# Patient Record
Sex: Female | Born: 1940 | Race: White | Hispanic: No | Marital: Married | Smoking: Never smoker
Health system: Southern US, Community
[De-identification: ages and names within clinical notes are randomized; demographics above are authoritative.]

## PROBLEM LIST (undated history)

## (undated) DIAGNOSIS — C801 Malignant (primary) neoplasm, unspecified: Secondary | ICD-10-CM

## (undated) DIAGNOSIS — E119 Type 2 diabetes mellitus without complications: Secondary | ICD-10-CM

## (undated) DIAGNOSIS — E785 Hyperlipidemia, unspecified: Secondary | ICD-10-CM

## (undated) HISTORY — PX: OTHER SURGICAL HISTORY: SHX169

---

## 2004-08-27 ENCOUNTER — Ambulatory Visit: Payer: Self-pay | Admitting: Internal Medicine

## 2004-08-28 ENCOUNTER — Ambulatory Visit (HOSPITAL_COMMUNITY): Admission: EM | Admit: 2004-08-28 | Discharge: 2004-08-28 | Payer: Self-pay | Admitting: Emergency Medicine

## 2004-11-30 ENCOUNTER — Emergency Department: Payer: Self-pay | Admitting: Emergency Medicine

## 2004-12-04 ENCOUNTER — Ambulatory Visit: Payer: Self-pay | Admitting: Internal Medicine

## 2004-12-23 ENCOUNTER — Ambulatory Visit: Payer: Self-pay | Admitting: Internal Medicine

## 2005-02-04 ENCOUNTER — Emergency Department: Payer: Self-pay | Admitting: Emergency Medicine

## 2005-04-21 ENCOUNTER — Ambulatory Visit: Payer: Self-pay | Admitting: Internal Medicine

## 2006-05-07 ENCOUNTER — Ambulatory Visit: Payer: Self-pay | Admitting: Internal Medicine

## 2007-05-11 ENCOUNTER — Ambulatory Visit: Payer: Self-pay | Admitting: Internal Medicine

## 2008-01-03 ENCOUNTER — Ambulatory Visit: Payer: Self-pay | Admitting: Gastroenterology

## 2009-02-27 ENCOUNTER — Ambulatory Visit: Payer: Self-pay | Admitting: Internal Medicine

## 2010-07-31 ENCOUNTER — Ambulatory Visit: Payer: Self-pay | Admitting: Internal Medicine

## 2011-08-27 ENCOUNTER — Ambulatory Visit: Payer: Self-pay | Admitting: Ophthalmology

## 2011-10-07 ENCOUNTER — Ambulatory Visit: Payer: Self-pay | Admitting: Ophthalmology

## 2012-03-18 ENCOUNTER — Ambulatory Visit: Payer: Self-pay | Admitting: Family Medicine

## 2013-06-02 ENCOUNTER — Ambulatory Visit: Payer: Self-pay | Admitting: Family Medicine

## 2013-08-03 ENCOUNTER — Ambulatory Visit: Payer: Self-pay | Admitting: Gastroenterology

## 2013-08-04 LAB — PATHOLOGY REPORT

## 2014-06-19 ENCOUNTER — Ambulatory Visit: Payer: Self-pay | Admitting: Family Medicine

## 2014-11-07 ENCOUNTER — Emergency Department: Payer: Self-pay | Admitting: Emergency Medicine

## 2014-11-13 ENCOUNTER — Emergency Department: Payer: Self-pay | Admitting: Emergency Medicine

## 2014-11-18 ENCOUNTER — Emergency Department: Payer: Self-pay | Admitting: Emergency Medicine

## 2015-06-01 ENCOUNTER — Other Ambulatory Visit: Payer: Self-pay | Admitting: Family Medicine

## 2015-06-01 DIAGNOSIS — Z1231 Encounter for screening mammogram for malignant neoplasm of breast: Secondary | ICD-10-CM

## 2015-06-21 ENCOUNTER — Other Ambulatory Visit: Payer: Self-pay | Admitting: Family Medicine

## 2015-06-21 ENCOUNTER — Ambulatory Visit
Admission: RE | Admit: 2015-06-21 | Discharge: 2015-06-21 | Disposition: A | Discharge status: Home / Self Care | Payer: Medicare PPO | Source: Ambulatory Visit | Attending: Family Medicine | Admitting: Family Medicine

## 2015-06-21 DIAGNOSIS — Z1231 Encounter for screening mammogram for malignant neoplasm of breast: Secondary | ICD-10-CM

## 2016-05-06 ENCOUNTER — Emergency Department: Payer: Medicare Other

## 2016-05-06 ENCOUNTER — Emergency Department
Admission: EM | Admit: 2016-05-06 | Discharge: 2016-05-07 | Disposition: A | Discharge status: Home / Self Care | Payer: Medicare Other | Attending: Emergency Medicine | Admitting: Emergency Medicine

## 2016-05-06 DIAGNOSIS — R51 Headache: Secondary | ICD-10-CM | POA: Diagnosis present

## 2016-05-06 DIAGNOSIS — E785 Hyperlipidemia, unspecified: Secondary | ICD-10-CM | POA: Insufficient documentation

## 2016-05-06 DIAGNOSIS — E119 Type 2 diabetes mellitus without complications: Secondary | ICD-10-CM | POA: Diagnosis not present

## 2016-05-06 DIAGNOSIS — G4485 Primary stabbing headache: Secondary | ICD-10-CM | POA: Insufficient documentation

## 2016-05-06 DIAGNOSIS — Z86718 Personal history of other venous thrombosis and embolism: Secondary | ICD-10-CM | POA: Diagnosis not present

## 2016-05-06 DIAGNOSIS — E039 Hypothyroidism, unspecified: Secondary | ICD-10-CM | POA: Diagnosis not present

## 2016-05-06 LAB — CBC WITH DIFFERENTIAL/PLATELET
Basophils Absolute: 0.1 10*3/uL (ref 0–0.1)
Basophils Relative: 1 %
Eosinophils Absolute: 0.3 10*3/uL (ref 0–0.7)
Eosinophils Relative: 4 %
HCT: 39.7 % (ref 35.0–47.0)
Hemoglobin: 13.2 g/dL (ref 12.0–16.0)
Lymphocytes Relative: 36 %
Lymphs Abs: 2.9 10*3/uL (ref 1.0–3.6)
MCH: 28.5 pg (ref 26.0–34.0)
MCHC: 33.3 g/dL (ref 32.0–36.0)
MCV: 85.7 fL (ref 80.0–100.0)
Monocytes Absolute: 0.5 10*3/uL (ref 0.2–0.9)
Monocytes Relative: 7 %
Neutro Abs: 4.3 10*3/uL (ref 1.4–6.5)
Neutrophils Relative %: 52 %
Platelets: 209 10*3/uL (ref 150–440)
RBC: 4.62 MIL/uL (ref 3.80–5.20)
RDW: 13.7 % (ref 11.5–14.5)
WBC: 8.1 10*3/uL (ref 3.6–11.0)

## 2016-05-06 LAB — SEDIMENTATION RATE: Sed Rate: 17 mm/hr (ref 0–30)

## 2016-05-06 LAB — BASIC METABOLIC PANEL
Anion gap: 7 (ref 5–15)
BUN: 16 mg/dL (ref 6–20)
CO2: 26 mmol/L (ref 22–32)
Calcium: 9.1 mg/dL (ref 8.9–10.3)
Chloride: 108 mmol/L (ref 101–111)
Creatinine, Ser: 0.75 mg/dL (ref 0.44–1.00)
GFR calc Af Amer: >60 mL/min (ref >60)
GFR calc non Af Amer: >60 mL/min (ref >60)
Glucose, Bld: 150 mg/dL — ABNORMAL HIGH (ref 65–99)
Potassium: 4.2 mmol/L (ref 3.5–5.1)
Sodium: 141 mmol/L (ref 135–145)

## 2016-05-06 MED ORDER — KETOROLAC TROMETHAMINE 30 MG/ML IJ SOLN
10.0000 mg | Freq: Once | INTRAMUSCULAR | Status: AC
Start: 2016-05-07 — End: 2016-05-06
  Administered 2016-05-06: 9.9 mg via INTRAVENOUS
  Filled 2016-05-06: qty 1

## 2016-05-06 NOTE — ED Notes (Signed)
Pt in with co severe headache since 1500 today no hx of headache states worst ever.

## 2016-05-06 NOTE — ED Notes (Signed)
Pt reports she has a sharp stabbing pain in her head - She reports that the pain comes and goes - This issue has been present since 3pm today - Pt denies nausea and vomiting - Pt denies dizziness - Pt reports vision is normal

## 2016-05-06 NOTE — ED Provider Notes (Signed)
[facility] Emergency Department Provider Note   ____________________________________________  Time seen: Approximately 11:17 PM  I have reviewed the triage vital signs and the nursing notes.   HISTORY  Chief Complaint Headache    HPI Kathryn Webster is a 75 y.o. female who presents to the ED from home with a chief complaint of headache. Patient describes onset of sharp stabbing pains to her left parietal head which awoke her approximately 3 PM from a nap. Pains last a few seconds then goes away. Symptoms are not associated with photophobia, dizziness, lightheadedness, nausea, vomiting or neck pain. Patient presents this evening secondary to increased frequency of sharp stabbing pains to her left head. Denies recent fever, chills, chest pain, shortness of breath, abdominal pain. Denies recent travel or trauma. Nothing makes her symptoms better or worse.Denies recent tick bite. States she had migraines many years ago but none since.   Past medical history Hyperlipidemia, unspecified    Hypothyroidism, unspecified    Stress incontinence    Osteopenia  DEXA 07/31/2011  Diabetes mellitus type 2, uncomplicated (CMS-HCC)    DVT (deep venous thrombosis) (CMS-HCC)      There are no active problems to display for this patient.   Past surgical history CHOLECYSTECTOMY 1992    TONSILLECTOMY     COLONOSCOPY 08/03/2013  one small polyp in ascending colon - Dr. Verdie Shire; repeat in 5 years  UPPER ENDOSCOPY 08/15/1982, 01/03/2008  Dr. Sonny Masters  Right leg laser surgery       No current outpatient prescriptions on file.  Allergies Botox  Family history None for migraines or cerebral aneurysm  Social History Social History  Substance Use Topics  . Smoking status: Not on file  . Smokeless tobacco: Not on file  . Alcohol Use: Not on file  Nonsmoker  Review of Systems  Constitutional: No fever/chills. Eyes: No visual changes. ENT: No sore  throat. Cardiovascular: Denies chest pain. Respiratory: Denies shortness of breath. Gastrointestinal: No abdominal pain.  No nausea, no vomiting.  No diarrhea.  No constipation. Genitourinary: Negative for dysuria. Musculoskeletal: Negative for back pain. Skin: Negative for rash. Neurological: Positive for headache. Negative for focal weakness or numbness.  10-point ROS otherwise negative.  ____________________________________________   PHYSICAL EXAM:  VITAL SIGNS: ED Triage Vitals  Enc Vitals Group     BP 05/06/16 2146 142/62 mmHg     Pulse Rate 05/06/16 2146 88     Resp 05/06/16 2146 18     Temp 05/06/16 2146 97.8 F (36.6 C)     Temp Source 05/06/16 2146 Oral     SpO2 05/06/16 2146 100 %     Weight 05/06/16 2146 120 lb (54.432 kg)     Height 05/06/16 2146 5\' 2"  (1.575 m)     Head Cir --      Peak Flow --      Pain Score 05/06/16 2147 8     Pain Loc --      Pain Edu? --      Excl. in [place]? --     Constitutional: Alert and oriented. Well appearing and in no acute distress. Eyes: Conjunctivae are normal. PERRL. EOMI. Fundoscopy WNL. Head: Atraumatic. Temples are nontender. Ears: Slight fluid behind bilateral TMs, otherwise WNL. Nose: No congestion/rhinnorhea. Mouth/Throat: Mucous membranes are moist.  Oropharynx non-erythematous. Neck: No stridor.  No carotid bruits.  No neck without meningismus. Cardiovascular: Normal rate, regular rhythm. Grossly normal heart sounds.  Good peripheral circulation. Respiratory: Normal respiratory effort.  No  retractions. Lungs CTAB. Gastrointestinal: Soft and nontender. No distention. No abdominal bruits. No CVA tenderness. Musculoskeletal: No lower extremity tenderness nor edema.  No joint effusions. Neurologic:  Normal speech and language. No gross focal neurologic deficits are appreciated. No gait instability. Skin:  Skin is warm, dry and intact. No rash noted. Psychiatric: Mood and affect are normal. Speech and behavior are  normal.  ____________________________________________   LABS (all labs ordered are listed, but only abnormal results are displayed)  Labs Reviewed  BASIC METABOLIC PANEL - Abnormal; Notable for the following:    Glucose, Bld 150 (*)    All other components within normal limits  CBC WITH DIFFERENTIAL/PLATELET  SEDIMENTATION RATE   ____________________________________________  EKG  None ____________________________________________  RADIOLOGY  CT head without contrast interpreted per Dr. Radene Knee: 1. No acute intracranial pathology seen on CT. 2. Mild small vessel ischemic microangiopathy. ____________________________________________   PROCEDURES  Procedure(s) performed: None  Critical Care performed: No  ____________________________________________   INITIAL IMPRESSION / ASSESSMENT AND PLAN / ED COURSE  Pertinent labs & imaging results that were available during my care of the patient were reviewed by me and considered in my medical decision making (see chart for details).  75 year old female who presents with sharp stabbing pains to her left parietal head lasting seconds. Neck is supple without focal neurological deficits on exam. During my interview she visibly winces when the headaches hit. She denies pain in her face. We discussed whether she may be starting to have symptoms of trigeminal neuralgia. I entertained the thought of starting her on carbamazepine. I was going to discuss this with the on-call neurologist; however, there is no neurologist available to me at this time for discussion. I did offer patient a [facility] neurology consultation but it turns out that her spouse works at [employer] [facility] and will be able to get her in to see neurology there. If not, he use to work with [facility] neurology group and is confident he will be able to secure her an appointment shortly. For the time being, patient was given low-dose IV Toradol in an attempt to dampen future pain. She has  Vicodin at home which she will take in addition to ibuprofen as needed. Strict return precautions given. Both verbalize understanding and agree with plan of care. ____________________________________________   FINAL CLINICAL IMPRESSION(S) / ED DIAGNOSES  Final diagnoses:  Primary stabbing headache      NEW MEDICATIONS STARTED DURING THIS VISIT:  New Prescriptions   No medications on file     Note:  This document was prepared using Dragon voice recognition software and may include unintentional dictation errors.    Paulette Blanch, MD 05/07/16 [phone]

## 2016-05-07 NOTE — Discharge Instructions (Signed)
1. You may take ibuprofen and/or vicodin as needed for headache. 2. Return to the ER for worsening symptoms, persistent vomiting, lethargy or other concerns.  General Headache Without Cause A headache is pain or discomfort felt around the head or neck area. The specific cause of a headache may not be found. There are many causes and types of headaches. A few common ones are:  Tension headaches.  Migraine headaches.  Cluster headaches.  Chronic daily headaches. HOME CARE INSTRUCTIONS  Watch your condition for any changes. Take these steps to help with your condition: Managing Pain  Take over-the-counter and prescription medicines only as told by your health care provider.  Lie down in a dark, quiet room when you have a headache.  If directed, apply ice to the head and neck area:  Put ice in a plastic bag.  Place a towel between your skin and the bag.  Leave the ice on for 20 minutes, 2-3 times per day.  Use a heating pad or hot shower to apply heat to the head and neck area as told by your health care provider.  Keep lights dim if bright lights bother you or make your headaches worse. Eating and Drinking  Eat meals on a regular schedule.  Limit alcohol use.  Decrease the amount of caffeine you drink, or stop drinking caffeine. General Instructions  Keep all follow-up visits as told by your health care provider. This is important.  Keep a headache journal to help find out what may trigger your headaches. For example, write down:  What you eat and drink.  How much sleep you get.  Any change to your diet or medicines.  Try massage or other relaxation techniques.  Limit stress.  Sit up straight, and do not tense your muscles.  Do not use tobacco products, including cigarettes, chewing tobacco, or e-cigarettes. If you need help quitting, ask your health care provider.  Exercise regularly as told by your health care provider.  Sleep on a regular schedule. Get 7-9  hours of sleep, or the amount recommended by your health care provider. SEEK MEDICAL CARE IF:   Your symptoms are not helped by medicine.  You have a headache that is different from the usual headache.  You have nausea or you vomit.  You have a fever. SEEK IMMEDIATE MEDICAL CARE IF:   Your headache becomes severe.  You have repeated vomiting.  You have a stiff neck.  You have a loss of vision.  You have problems with speech.  You have pain in the eye or ear.  You have muscular weakness or loss of muscle control.  You lose your balance or have trouble walking.  You feel faint or pass out.  You have confusion.   This information is not intended to replace advice given to you by your health care provider. Make sure you discuss any questions you have with your health care provider.   Document Released: 11/10/2005 Document Revised: 08/01/2015 Document Reviewed: 03/05/2015 Elsevier Interactive Patient Education [employer].

## 2016-11-06 ENCOUNTER — Emergency Department
Admission: EM | Admit: 2016-11-06 | Discharge: 2016-11-06 | Disposition: A | Discharge status: Home / Self Care | Payer: Medicare Other | Attending: Emergency Medicine | Admitting: Emergency Medicine

## 2016-11-06 ENCOUNTER — Emergency Department: Payer: Medicare Other

## 2016-11-06 DIAGNOSIS — R1013 Epigastric pain: Secondary | ICD-10-CM | POA: Diagnosis present

## 2016-11-06 DIAGNOSIS — E119 Type 2 diabetes mellitus without complications: Secondary | ICD-10-CM | POA: Diagnosis not present

## 2016-11-06 DIAGNOSIS — Z7984 Long term (current) use of oral hypoglycemic drugs: Secondary | ICD-10-CM | POA: Diagnosis not present

## 2016-11-06 DIAGNOSIS — R109 Unspecified abdominal pain: Secondary | ICD-10-CM

## 2016-11-06 HISTORY — DX: Type 2 diabetes mellitus without complications: E11.9

## 2016-11-06 HISTORY — DX: Hyperlipidemia, unspecified: E78.5

## 2016-11-06 HISTORY — DX: Malignant (primary) neoplasm, unspecified: C80.1

## 2016-11-06 LAB — URINALYSIS, COMPLETE (UACMP) WITH MICROSCOPIC
Bacteria, UA: NONE SEEN
Bilirubin Urine: NEGATIVE
Glucose, UA: 50 mg/dL — AB
Hgb urine dipstick: NEGATIVE
Ketones, ur: 20 mg/dL — AB
Nitrite: NEGATIVE
Protein, ur: NEGATIVE mg/dL
RBC / HPF: NONE SEEN RBC/hpf (ref 0–5)
Specific Gravity, Urine: 1.017 (ref 1.005–1.030)
pH: 7 (ref 5.0–8.0)

## 2016-11-06 LAB — COMPREHENSIVE METABOLIC PANEL
ALT: 13 U/L — ABNORMAL LOW (ref 14–54)
AST: 25 U/L (ref 15–41)
Albumin: 3.8 g/dL (ref 3.5–5.0)
Alkaline Phosphatase: 47 U/L (ref 38–126)
Anion gap: 7 (ref 5–15)
BUN: 11 mg/dL (ref 6–20)
CO2: 26 mmol/L (ref 22–32)
Calcium: 9.1 mg/dL (ref 8.9–10.3)
Chloride: 108 mmol/L (ref 101–111)
Creatinine, Ser: 0.69 mg/dL (ref 0.44–1.00)
GFR calc Af Amer: >60 mL/min (ref >60)
GFR calc non Af Amer: >60 mL/min (ref >60)
Glucose, Bld: 134 mg/dL — ABNORMAL HIGH (ref 65–99)
Potassium: 3.7 mmol/L (ref 3.5–5.1)
Sodium: 141 mmol/L (ref 135–145)
Total Bilirubin: 0.8 mg/dL (ref 0.3–1.2)
Total Protein: 7 g/dL (ref 6.5–8.1)

## 2016-11-06 LAB — CBC
HCT: 40.8 % (ref 35.0–47.0)
Hemoglobin: 13.8 g/dL (ref 12.0–16.0)
MCH: 29 pg (ref 26.0–34.0)
MCHC: 33.9 g/dL (ref 32.0–36.0)
MCV: 85.5 fL (ref 80.0–100.0)
Platelets: 210 10*3/uL (ref 150–440)
RBC: 4.77 MIL/uL (ref 3.80–5.20)
RDW: 13.6 % (ref 11.5–14.5)
WBC: 9.3 10*3/uL (ref 3.6–11.0)

## 2016-11-06 LAB — TROPONIN I

## 2016-11-06 LAB — LIPASE, BLOOD: Lipase: 32 U/L (ref 11–51)

## 2016-11-06 MED ORDER — FAMOTIDINE IN NACL 20-0.9 MG/50ML-% IV SOLN
20.0000 mg | Freq: Once | INTRAVENOUS | Status: AC
  Administered 2016-11-06: 20 mg via INTRAVENOUS
  Filled 2016-11-06: qty 50

## 2016-11-06 MED ORDER — IOPAMIDOL (ISOVUE-300) INJECTION 61%
30.0000 mL | Freq: Once | INTRAVENOUS | Status: AC
  Administered 2016-11-06: 30 mL via ORAL

## 2016-11-06 MED ORDER — FAMOTIDINE 20 MG PO TABS: 20.0000 mg | ORAL_TABLET | Freq: Two times a day (BID) | ORAL | 1 refills | Status: AC

## 2016-11-06 MED ORDER — IOPAMIDOL (ISOVUE-300) INJECTION 61%
75.0000 mL | Freq: Once | INTRAVENOUS | Status: AC | PRN
  Administered 2016-11-06: 100 mL via INTRAVENOUS

## 2016-11-06 MED ORDER — GI COCKTAIL ~~LOC~~
30.0000 mL | Freq: Once | ORAL | Status: AC
  Administered 2016-11-06: 30 mL via ORAL
  Filled 2016-11-06: qty 30

## 2016-11-06 MED ORDER — SODIUM CHLORIDE 0.9 % IV BOLUS (SEPSIS)
500.0000 mL | Freq: Once | INTRAVENOUS | Status: AC
  Administered 2016-11-06: 500 mL via INTRAVENOUS

## 2016-11-06 MED ORDER — FENTANYL CITRATE (PF) 100 MCG/2ML IJ SOLN
50.0000 ug | Freq: Once | INTRAMUSCULAR | Status: AC
  Administered 2016-11-06: 50 ug via INTRAVENOUS
  Filled 2016-11-06: qty 2

## 2016-11-06 NOTE — ED Notes (Signed)
Assisted pt to potty, steady gait.

## 2016-11-06 NOTE — ED Provider Notes (Signed)
[facility] Emergency Department Provider Note  ____________________________________________   I have reviewed the triage vital signs and the nursing notes.   HISTORY  Chief Complaint Abdominal Pain and Back Pain    HPI Kathryn Webster is a 74 y.o. female who apparently, it is later recollected, did have her gallbladder taken out, denies any other surgical history, states she has had epigastric abdominal discomfort since last night. Worse with food. Radiates towards her back. Denies any fever or chills. Denies any chest pain. Denies any nausea vomiting or diarrhea. It's a burning-like discomfort. Does have a history of esophageal stricture. Does not feel that she has any food stuck in her throat. Able to handle her secretions. Patient states that she has had no exertional symptoms or chest pain. Just this burning discomfort since last night. She specifically denies any melena bright red blood per rectum or hematemesis. She believes she has had this before but she can't recall quite when.     Past Medical History:  Diagnosis Date  . Diabetes mellitus without complication ([place])   . Hyperlipidemia     There are no active problems to display for this patient.   Past Surgical History:  Procedure Laterality Date  . skin cancer biopsy      Prior to Admission medications   Medication Sig Start Date End Date Taking? Authorizing Provider  Calcium Carbonate-Vit D-Min (CALCIUM 1200 PO) Take 1,200 mg by mouth daily.   Yes Historical Provider, MD  donepezil (ARICEPT) 5 MG tablet Take 5 mg by mouth at bedtime. 10/20/16  Yes Historical Provider, MD  levothyroxine (SYNTHROID, LEVOTHROID) 75 MCG tablet Take 75 mcg by mouth daily. 08/09/16  Yes Historical Provider, MD  metFORMIN (GLUCOPHAGE) 500 MG tablet Take 500 mg by mouth daily. 08/09/16  Yes Historical Provider, MD  raloxifene (EVISTA) 60 MG tablet Take 60 mg by mouth daily. 09/30/16  Yes Historical Provider, MD   simvastatin (ZOCOR) 20 MG tablet Take 20 mg by mouth at bedtime. 09/30/16  Yes Historical Provider, MD  tolterodine (DETROL LA) 4 MG 24 hr capsule Take 4 mg by mouth daily. 09/30/16  Yes Historical Provider, MD    Allergies Botox [botulinum toxin type a]  No family history on file.  Social History Social History  Substance Use Topics  . Smoking status: Never Smoker  . Smokeless tobacco: Never Used  . Alcohol use No    Review of Systems Constitutional: No fever/chills Eyes: No visual changes. ENT: No sore throat. No stiff neck no neck pain Cardiovascular: Denies chest pain. Respiratory: Denies shortness of breath. Gastrointestinal:   no vomiting.  No diarrhea.  No constipation. Genitourinary: Negative for dysuria. Musculoskeletal: Negative lower extremity swelling Skin: Negative for rash. Neurological: Negative for severe headaches, focal weakness or numbness. 10-point ROS otherwise negative.  ____________________________________________   PHYSICAL EXAM:  VITAL SIGNS: ED Triage Vitals  Enc Vitals Group     BP 11/06/16 0638 (!) 147/57     Pulse Rate 11/06/16 0638 78     Resp 11/06/16 0638 16     Temp 11/06/16 0638 97.9 F (36.6 C)     Temp Source 11/06/16 0638 Oral     SpO2 11/06/16 0638 100 %     Weight 11/06/16 0639 122 lb (55.3 kg)     Height 11/06/16 0639 5\' 2"  (1.575 m)     Head Circumference --      Peak Flow --      Pain Score 11/06/16 0639 7  Pain Loc --      Pain Edu? --      Excl. in [place]? --     Constitutional: Alert and oriented. Well appearing and in no acute distress. Eyes: Conjunctivae are normal. PERRL. EOMI. Head: Atraumatic. Nose: No congestion/rhinnorhea. Mouth/Throat: Mucous membranes are moist.  Oropharynx non-erythematous. Neck: No stridor.   Nontender with no meningismus Cardiovascular: Normal rate, regular rhythm. Grossly normal heart sounds.  Good peripheral circulation. Respiratory: Normal respiratory effort.  No retractions.  Lungs CTAB. Abdominal: Soft and Minimal epigastric abdominal discomfort. No distention. No guarding no rebound Back:  There is no focal tenderness or step off.  there is no midline tenderness there are no lesions noted. there is no CVA tenderness Musculoskeletal: No lower extremity tenderness, no upper extremity tenderness. No joint effusions, no DVT signs strong distal pulses no edema Neurologic:  Normal speech and language. No gross focal neurologic deficits are appreciated.  Skin:  Skin is warm, dry and intact. No rash noted. Psychiatric: Mood and affect are normal. Speech and behavior are normal.  ____________________________________________   LABS (all labs ordered are listed, but only abnormal results are displayed)  Labs Reviewed  COMPREHENSIVE METABOLIC PANEL - Abnormal; Notable for the following:       Result Value   Glucose, Bld 134 (*)    ALT 13 (*)    All other components within normal limits  URINALYSIS, COMPLETE (UACMP) WITH MICROSCOPIC - Abnormal; Notable for the following:    Color, Urine YELLOW (*)    APPearance CLEAR (*)    Glucose, UA 50 (*)    Ketones, ur 20 (*)    Leukocytes, UA TRACE (*)    Squamous Epithelial / LPF 0-5 (*)    All other components within normal limits  LIPASE, BLOOD  CBC  TROPONIN I   ____________________________________________  EKG  I personally interpreted any EKGs ordered by me or triage Normal sinus rhythm rate 70 bpm no acute ST elevation or acute ST depression normal axis nonspecific ST changes no acute ischemia ____________________________________________  RADIOLOGY  I reviewed any imaging ordered by me or triage that were performed during my shift and, if possible, patient and/or family made aware of any abnormal findings. ____________________________________________   PROCEDURES  Procedure(s) performed: None  Procedures  Critical Care performed: None  ____________________________________________   INITIAL  IMPRESSION / ASSESSMENT AND PLAN / ED COURSE  Pertinent labs & imaging results that were available during my care of the patient were reviewed by me and considered in my medical decision making (see chart for details).  Patient with very reproducible minimal abdominal discomfort but no surgical signs. Given her age I did do a EKG and a troponin which is negative. Despite pain since yesterday. I do not think that this likely represents ACS. Very reproducible food related discomfort in her epigastric region. However, because she is in her 75s and did do a CT scan and extensive blood work. CT scan is reassuring blood work is reassuring patient is pain-free at this time. I do believe this is most likely a stomach issue, either an ulcer which is not showing any evidence of bleeding, or other gastric pathology of that for ITP. Patient will need outpatient GI follow-up. She has no symptoms at this time. We'll give her antiacids here, we will encourage by mouth and if she can tolerate that without difficulty we'll discharge her home.At this time, there does not appear to be clinical evidence to support the diagnosis of pulmonary embolus, dissection,  myocarditis, endocarditis, pericarditis, pericardial tamponade, acute coronary syndrome, pneumothorax, pneumonia, or any other acute intrathoracic pathology that will require admission or acute intervention. Nor is there evidence of any significant intra-abdominal pathology causing this discomfort.  ----------------------------------------- 9:55 AM on 11/06/2016 -----------------------------------------  Patient is very well-controlled on her pain at this time no complaints awaiting CT  Clinical Course    ____________________________________________   FINAL CLINICAL IMPRESSION(S) / ED DIAGNOSES  Final diagnoses:  Abdominal pain      This chart was dictated using voice recognition software.  Despite best efforts to proofread,  errors can occur which can  change meaning.      Schuyler Amor, MD 11/06/16 1136

## 2016-11-06 NOTE — ED Triage Notes (Signed)
Pt presents to ED via POV with c/o epigastric pain with radiation into the lumbar region of her back since yesterday morning. Pt reports pain as constant, but denies CP, SHOB, nausea, vomiting, or diarrhea. Pt denies any recent changes in bowel or bladder habits. Pt is ambulatory to triage with steady gait, with no difficulty or distress noted.

## 2016-11-06 NOTE — Discharge Instructions (Signed)
Return to the emergency room for any new or worrisome symptoms including increased pain, chest pain, shortness of breath, numbness, weakness, vomiting, fever, black stool, vomiting blood, any bleeding or you feel worse in any way. Follow closely with GI and her primary care doctor.

## 2016-11-06 NOTE — ED Notes (Signed)
Pt sitting up drinking contrast.  Reports pain has much improved since meds.

## 2017-05-18 ENCOUNTER — Other Ambulatory Visit: Payer: Self-pay | Admitting: Family Medicine

## 2017-05-18 DIAGNOSIS — Z1231 Encounter for screening mammogram for malignant neoplasm of breast: Secondary | ICD-10-CM

## 2017-06-09 ENCOUNTER — Ambulatory Visit
Admission: RE | Admit: 2017-06-09 | Discharge: 2017-06-09 | Disposition: A | Discharge status: Home / Self Care | Payer: Medicare Other | Source: Ambulatory Visit | Attending: Family Medicine | Admitting: Family Medicine

## 2017-06-09 DIAGNOSIS — Z1231 Encounter for screening mammogram for malignant neoplasm of breast: Secondary | ICD-10-CM | POA: Diagnosis not present

## 2018-01-26 IMAGING — CT CT ABD-PELV W/ CM
2 of 5 series · 16 of 46 positions shown, 18 images · IV contrast (APPLIED)
Comparison: None.

CLINICAL DATA: Epigastric pain and back pain.

EXAM:
CT ABDOMEN AND PELVIS WITH CONTRAST
TECHNIQUE: Multidetector CT imaging of the abdomen and pelvis was performed
using the standard protocol following bolus administration of
intravenous contrast.
CONTRAST:  100mL 0KTG3X-HEE IOPAMIDOL (0KTG3X-HEE) INJECTION 61%

[Series 2: routine abd/pel with · axial · 0.69mm/px · z∈[-453,-28]mm · 13 of 97 slices shown, 15 images]
[im 6/97  soft-tissue]
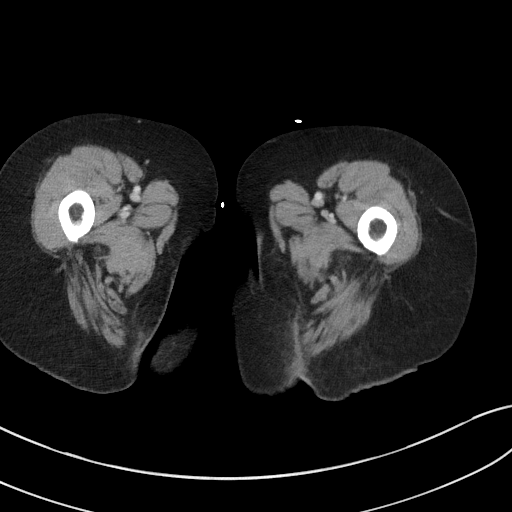
[im 6/97  bone]
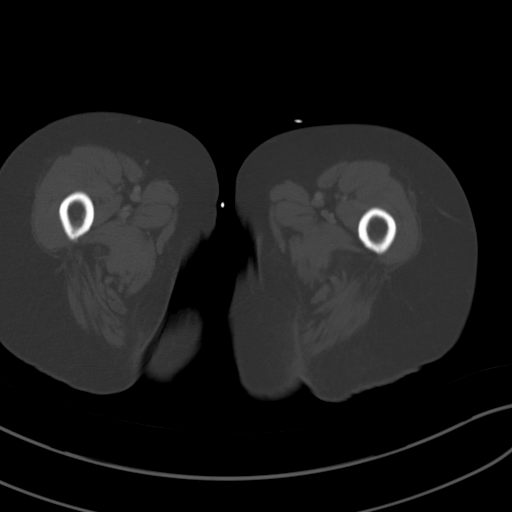
[im 16/97  soft-tissue]
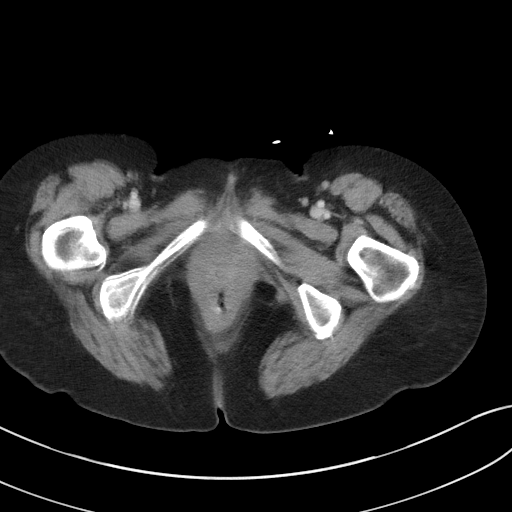
[im 21/97  soft-tissue]
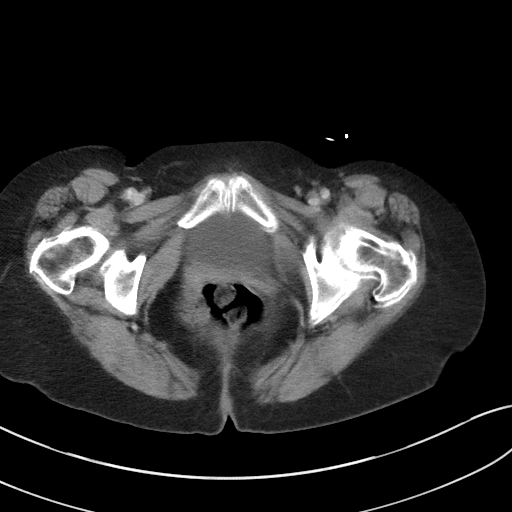
[im 26/97  soft-tissue]
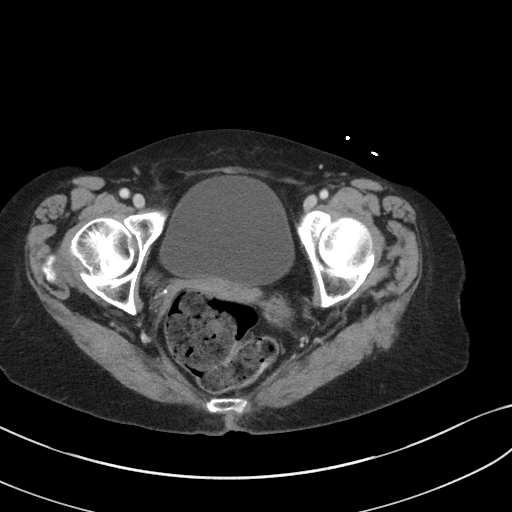
[im 36/97  soft-tissue]
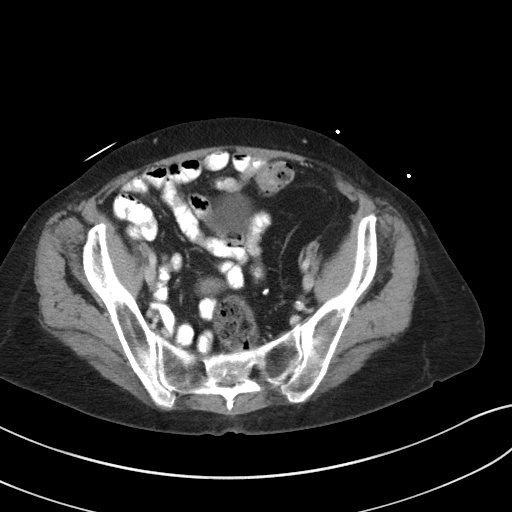
[im 41/97  soft-tissue]
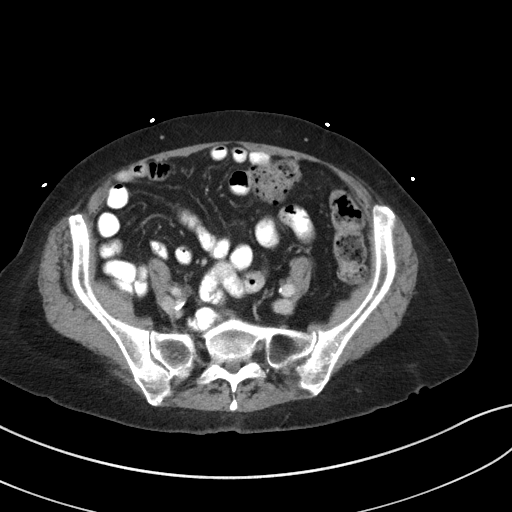
[im 51/97  soft-tissue]
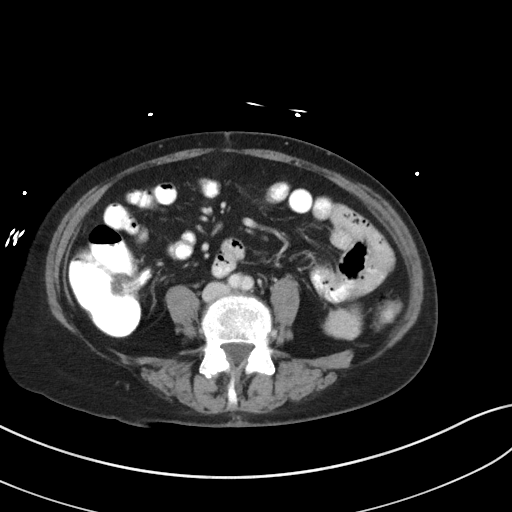
[im 56/97  soft-tissue]
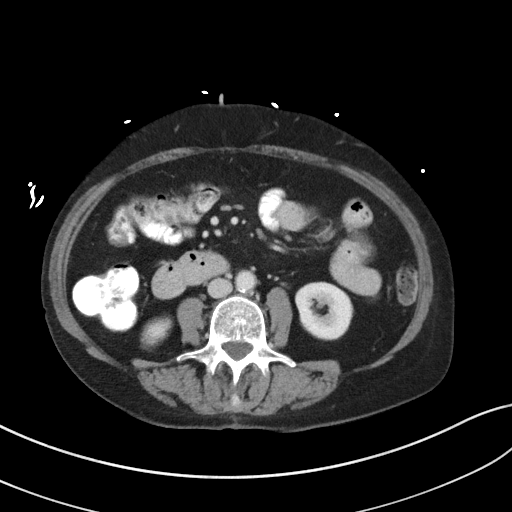
[im 61/97  soft-tissue]
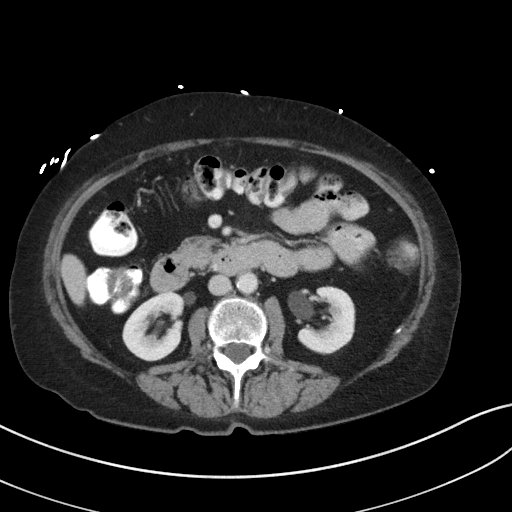
[im 61/97  bone]
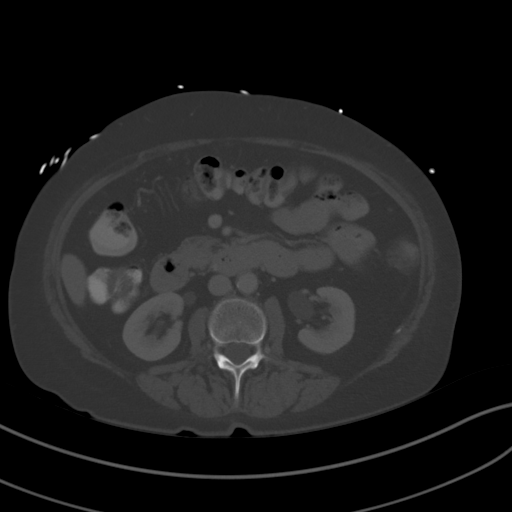
[im 71/97  soft-tissue]
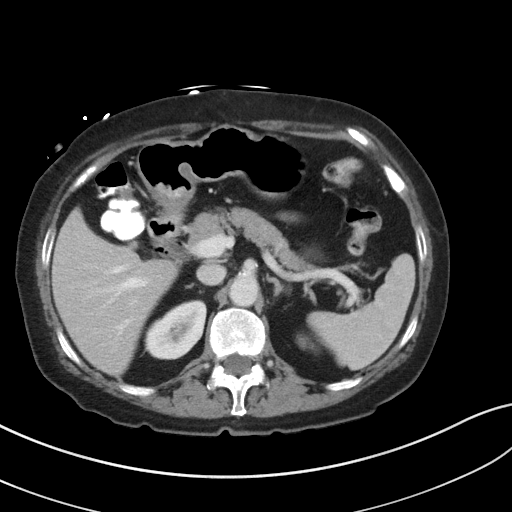
[im 76/97  soft-tissue]
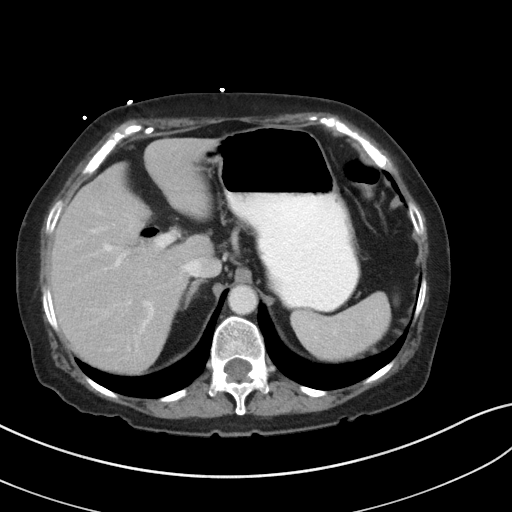
[im 81/97  soft-tissue]
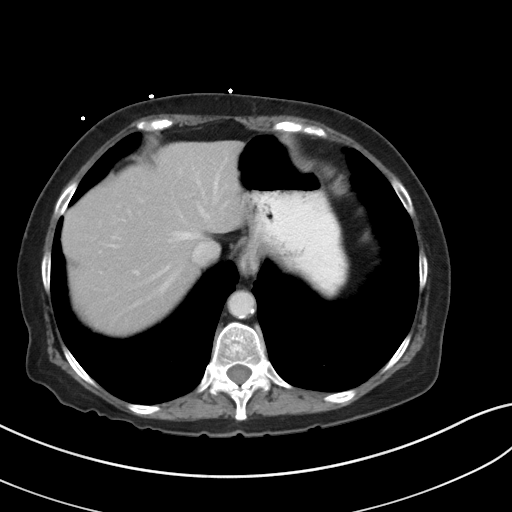
[im 91/97  soft-tissue]
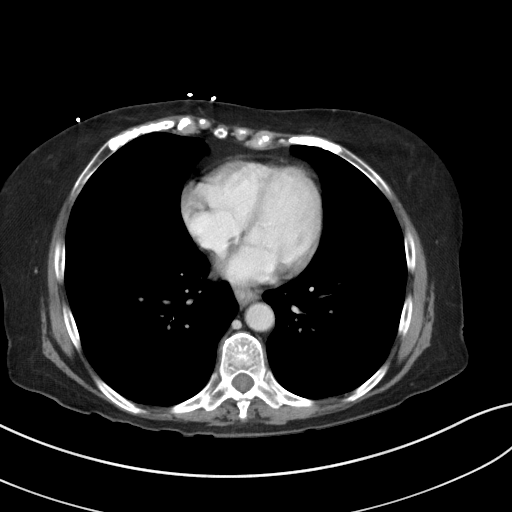

[Series 6: coronal st · coronal · 0.68mm/px · 3 of 81 slices shown]
[im 27/81  soft-tissue]
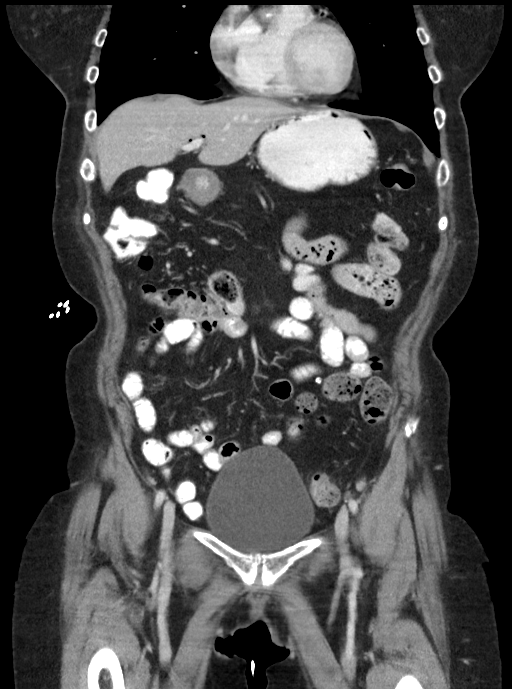
[im 36/81  soft-tissue]
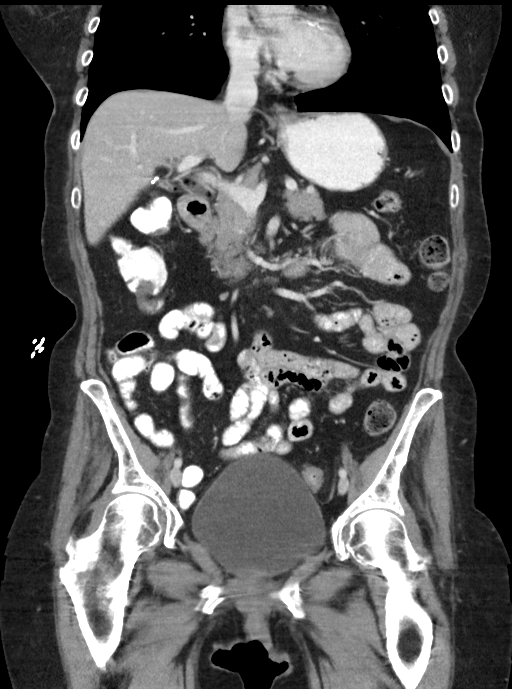
[im 45/81  soft-tissue]
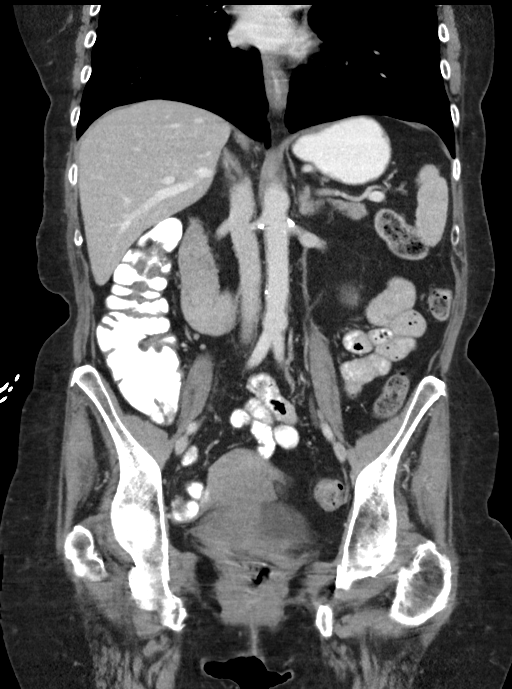

[16 of 46 positions shown; findings below may reference images not displayed]

FINDINGS: Lower chest: No acute abnormality.

Hepatobiliary: The liver shows probable mild steatosis. The
gallbladder has been removed. There is air in the biliary tree,
likely reflecting prior sphincterotomy. No evidence of biliary
ductal dilatation or visible choledocholithiasis.

Pancreas: Unremarkable. No pancreatic ductal dilatation or
surrounding inflammatory changes.

Spleen: Normal in size without focal abnormality.

Adrenals/Urinary Tract: Adrenal glands are unremarkable. Kidneys are
normal, without renal calculi, focal lesion, or hydronephrosis.
Bladder is unremarkable.

Stomach/Bowel: Bowel shows no evidence of inflammation or
obstruction. No free air or abscess identified. No focal lesions.

Vascular/Lymphatic: No significant vascular findings are present. No
enlarged abdominal or pelvic lymph nodes.

Reproductive: Uterus and bilateral adnexa are unremarkable.

Other: No abdominal wall hernia or abnormality. No abdominopelvic
ascites.

Musculoskeletal: No acute or significant osseous findings.
IMPRESSION: Unremarkable CT of the abdomen and pelvis.  No acute findings.

## 2018-03-04 ENCOUNTER — Other Ambulatory Visit (HOSPITAL_COMMUNITY): Payer: Self-pay | Admitting: Neurology

## 2018-03-04 DIAGNOSIS — F0280 Dementia in other diseases classified elsewhere without behavioral disturbance: Secondary | ICD-10-CM

## 2018-03-04 DIAGNOSIS — G3183 Dementia with Lewy bodies: Principal | ICD-10-CM

## 2018-03-10 ENCOUNTER — Ambulatory Visit (HOSPITAL_COMMUNITY)
Admission: RE | Admit: 2018-03-10 | Discharge: 2018-03-10 | Disposition: A | Discharge status: Home / Self Care | Payer: Medicare Other | Source: Ambulatory Visit | Attending: Neurology | Admitting: Neurology

## 2018-03-10 DIAGNOSIS — G3183 Dementia with Lewy bodies: Secondary | ICD-10-CM | POA: Insufficient documentation

## 2018-03-10 DIAGNOSIS — G319 Degenerative disease of nervous system, unspecified: Secondary | ICD-10-CM | POA: Diagnosis not present

## 2018-03-10 DIAGNOSIS — F0280 Dementia in other diseases classified elsewhere without behavioral disturbance: Secondary | ICD-10-CM | POA: Diagnosis present

## 2020-01-23 DEATH — deceased
